# Patient Record
Sex: Male | Born: 1960 | Race: White | Hispanic: No | Marital: Married | Smoking: Former smoker
Health system: Southern US, Community
[De-identification: ages and names within clinical notes are randomized; demographics above are authoritative.]

## PROBLEM LIST (undated history)

## (undated) DIAGNOSIS — C140 Malignant neoplasm of pharynx, unspecified: Secondary | ICD-10-CM

## (undated) DIAGNOSIS — E7800 Pure hypercholesterolemia, unspecified: Secondary | ICD-10-CM

## (undated) DIAGNOSIS — I10 Essential (primary) hypertension: Secondary | ICD-10-CM

## (undated) HISTORY — PX: NO PAST SURGERIES: SHX2092

## (undated) HISTORY — PX: COLONOSCOPY: SHX174

---

## 2012-05-23 ENCOUNTER — Ambulatory Visit: Payer: Self-pay | Admitting: Internal Medicine

## 2012-11-04 ENCOUNTER — Ambulatory Visit: Payer: Self-pay | Admitting: Gastroenterology

## 2015-07-03 DIAGNOSIS — C140 Malignant neoplasm of pharynx, unspecified: Secondary | ICD-10-CM

## 2015-07-03 HISTORY — DX: Malignant neoplasm of pharynx, unspecified: C14.0

## 2015-07-26 ENCOUNTER — Other Ambulatory Visit: Payer: Self-pay | Admitting: Otolaryngology

## 2015-07-26 DIAGNOSIS — C140 Malignant neoplasm of pharynx, unspecified: Secondary | ICD-10-CM

## 2015-07-30 ENCOUNTER — Encounter
Admission: RE | Admit: 2015-07-30 | Discharge: 2015-07-30 | Disposition: A | Discharge status: Home / Self Care | Payer: BLUE CROSS/BLUE SHIELD | Source: Ambulatory Visit | Attending: Otolaryngology | Admitting: Otolaryngology

## 2015-07-30 ENCOUNTER — Ambulatory Visit
Admission: RE | Admit: 2015-07-30 | Discharge: 2015-07-30 | Disposition: A | Discharge status: Home / Self Care | Payer: BLUE CROSS/BLUE SHIELD | Source: Ambulatory Visit | Attending: Otolaryngology | Admitting: Otolaryngology

## 2015-07-30 ENCOUNTER — Other Ambulatory Visit: Payer: Self-pay

## 2015-07-30 DIAGNOSIS — N200 Calculus of kidney: Secondary | ICD-10-CM | POA: Diagnosis not present

## 2015-07-30 DIAGNOSIS — C140 Malignant neoplasm of pharynx, unspecified: Secondary | ICD-10-CM

## 2015-07-30 DIAGNOSIS — Z85819 Personal history of malignant neoplasm of unspecified site of lip, oral cavity, and pharynx: Secondary | ICD-10-CM | POA: Diagnosis present

## 2015-07-30 DIAGNOSIS — C148 Malignant neoplasm of overlapping sites of lip, oral cavity and pharynx: Secondary | ICD-10-CM | POA: Insufficient documentation

## 2015-07-30 DIAGNOSIS — I700 Atherosclerosis of aorta: Secondary | ICD-10-CM | POA: Insufficient documentation

## 2015-07-30 HISTORY — DX: Pure hypercholesterolemia, unspecified: E78.00

## 2015-07-30 HISTORY — DX: Malignant neoplasm of pharynx, unspecified: C14.0

## 2015-07-30 HISTORY — DX: Essential (primary) hypertension: I10

## 2015-07-30 LAB — GLUCOSE, CAPILLARY: Glucose-Capillary: 84 mg/dL (ref 65–99)

## 2015-07-30 MED ORDER — FLUDEOXYGLUCOSE F - 18 (FDG) INJECTION
12.6780 | Freq: Once | INTRAVENOUS | Status: AC
  Administered 2015-07-30: 12.678 via INTRAVENOUS

## 2015-07-30 NOTE — Patient Instructions (Addendum)
  Your procedure is scheduled [id number] August 10 , 2017. Report to Same Day Surgery. To find out your arrival time please call [phone] between 1PM - 3PM on Wednesday August 11, 2015.  Remember: Instructions that are not followed completely may result in serious medical risk, up to and including death, or upon the discretion of your surgeon and anesthesiologist your surgery may need to be rescheduled.    _x___ 1. Do not eat food or drink liquids after midnight. No gum chewing or  hard candies.     _x___ 2. No Alcohol for 24 hours before or after surgery.   ____ 3. Bring all medications with you on the day of surgery if instructed.    __x__ 4. Notify your doctor if there is any change in your medical condition     (cold, fever, infections).  ___x_ 5. No smoking for 24 hours prior to surgery.    Do not wear jewelry, make-up, hairpins, clips or nail polish.  Do not wear lotions, powders, or perfumes. You may wear deodorant.  Do not shave 48 hours prior to surgery. Men may shave face and neck.  Do not bring valuables to the hospital.    [facility] is not responsible for any belongings or valuables.               Contacts, dentures or bridgework may not be worn into surgery.  Leave your suitcase in the car. After surgery it may be brought to your room.  For patients admitted to the hospital, discharge time is determined by your treatment team.   Patients discharged the day of surgery will not be allowed to drive home.    Please read over the following fact sheets that you were given:   [facility] Preparing for Surgery  ____ Take these medicines the morning of surgery with A SIP OF WATER: none    ____ Fleet Enema (as directed)   ____ Use CHG Soap as directed on instruction sheet  ____ Use inhalers on the day of surgery and bring to hospital day of surgery  ____ Stop metformin 2 days prior to surgery    ____ Take 1/2 of usual insulin dose the night before surgery and none  on the morning of surgery.   ____ Stop Coumadin/Plavix/aspirin on does not apply.  _x__ Stop Anti-inflammatories such as Advil, Aleve, Ibuprofen, Motrin, Naproxen,  Naprosyn, Goodies powders or aspirin products. OK to take Tylenol for pain.   _x___ Stop supplements Vitamin C until after surgery.    _x___ Bring C-Pap to the hospital.

## 2015-07-30 NOTE — Pre-Procedure Instructions (Signed)
Medical clearance in chart from Dr. Ouida Sills.  EKG Normal Sinus Rhythm with right bundle branch block, no prior EKG to compare.  Spoke with Dr. Rosey Bath, [place] to proceed.

## 2015-08-09 MED ORDER — SODIUM CHLORIDE FLUSH 0.9 % IV SOLN
INTRAVENOUS | Status: AC
  Filled 2015-08-09: qty 10

## 2015-08-12 ENCOUNTER — Ambulatory Visit: Payer: BLUE CROSS/BLUE SHIELD | Admitting: Anesthesiology

## 2015-08-12 ENCOUNTER — Encounter
Admission: RE | Disposition: A | Discharge status: Home / Self Care | Payer: Self-pay | Source: Ambulatory Visit | Attending: Otolaryngology

## 2015-08-12 ENCOUNTER — Observation Stay
Admission: RE | Admit: 2015-08-12 | Discharge: 2015-08-13 | Disposition: A | Discharge status: Home / Self Care | Payer: BLUE CROSS/BLUE SHIELD | Source: Ambulatory Visit | Attending: Otolaryngology | Admitting: Otolaryngology

## 2015-08-12 DIAGNOSIS — E785 Hyperlipidemia, unspecified: Secondary | ICD-10-CM | POA: Insufficient documentation

## 2015-08-12 DIAGNOSIS — I10 Essential (primary) hypertension: Secondary | ICD-10-CM | POA: Diagnosis not present

## 2015-08-12 DIAGNOSIS — Z87891 Personal history of nicotine dependence: Secondary | ICD-10-CM | POA: Insufficient documentation

## 2015-08-12 DIAGNOSIS — G4733 Obstructive sleep apnea (adult) (pediatric): Secondary | ICD-10-CM | POA: Insufficient documentation

## 2015-08-12 DIAGNOSIS — J351 Hypertrophy of tonsils: Secondary | ICD-10-CM | POA: Diagnosis not present

## 2015-08-12 DIAGNOSIS — C051 Malignant neoplasm of soft palate: Principal | ICD-10-CM | POA: Insufficient documentation

## 2015-08-12 DIAGNOSIS — Z8249 Family history of ischemic heart disease and other diseases of the circulatory system: Secondary | ICD-10-CM | POA: Diagnosis not present

## 2015-08-12 DIAGNOSIS — Z79899 Other long term (current) drug therapy: Secondary | ICD-10-CM | POA: Insufficient documentation

## 2015-08-12 DIAGNOSIS — D3709 Neoplasm of uncertain behavior of other specified sites of the oral cavity: Secondary | ICD-10-CM | POA: Diagnosis present

## 2015-08-12 HISTORY — PX: TONSILLECTOMY: SHX5217

## 2015-08-12 HISTORY — PX: UVULOPALATOPHARYNGOPLASTY: SHX827

## 2015-08-12 SURGERY — TONSILLECTOMY
Anesthesia: General | Wound class: Clean Contaminated

## 2015-08-12 MED ORDER — FENTANYL CITRATE (PF) 100 MCG/2ML IJ SOLN
INTRAMUSCULAR | Status: AC
  Filled 2015-08-12: qty 2

## 2015-08-12 MED ORDER — PRAVASTATIN SODIUM 20 MG PO TABS: 20.0000 mg | ORAL_TABLET | Freq: Every day | ORAL | Status: DC

## 2015-08-12 MED ORDER — ACETAMINOPHEN 10 MG/ML IV SOLN
INTRAVENOUS | Status: DC | PRN
  Administered 2015-08-12: 1000 mg via INTRAVENOUS

## 2015-08-12 MED ORDER — FENTANYL CITRATE (PF) 100 MCG/2ML IJ SOLN
INTRAMUSCULAR | Status: DC | PRN
  Administered 2015-08-12 (×3): 50 ug via INTRAVENOUS

## 2015-08-12 MED ORDER — AMLODIPINE BESYLATE 5 MG PO TABS
5.0000 mg | ORAL_TABLET | Freq: Every day | ORAL | Status: DC
  Filled 2015-08-12: qty 1

## 2015-08-12 MED ORDER — BENAZEPRIL HCL 20 MG PO TABS
20.0000 mg | ORAL_TABLET | Freq: Every day | ORAL | Status: DC
  Filled 2015-08-12: qty 1

## 2015-08-12 MED ORDER — PROPOFOL 10 MG/ML IV BOLUS
INTRAVENOUS | Status: DC | PRN
  Administered 2015-08-12: 150 mg via INTRAVENOUS
  Administered 2015-08-12: 50 mg via INTRAVENOUS

## 2015-08-12 MED ORDER — FENTANYL CITRATE (PF) 100 MCG/2ML IJ SOLN
25.0000 ug | INTRAMUSCULAR | Status: AC | PRN
  Administered 2015-08-12 (×6): 25 ug via INTRAVENOUS

## 2015-08-12 MED ORDER — DEXTROSE-NACL 5-0.45 % IV SOLN
INTRAVENOUS | Status: DC
  Administered 2015-08-12 (×2): via INTRAVENOUS

## 2015-08-12 MED ORDER — LIDOCAINE HCL (CARDIAC) 20 MG/ML IV SOLN
INTRAVENOUS | Status: DC | PRN
  Administered 2015-08-12: 50 mg via INTRAVENOUS

## 2015-08-12 MED ORDER — ACETAMINOPHEN 10 MG/ML IV SOLN
INTRAVENOUS | Status: AC
  Filled 2015-08-12: qty 100

## 2015-08-12 MED ORDER — LABETALOL HCL 5 MG/ML IV SOLN
5.0000 mg | INTRAVENOUS | Status: AC | PRN
  Administered 2015-08-12 (×4): 5 mg via INTRAVENOUS

## 2015-08-12 MED ORDER — BUPIVACAINE HCL 0.5 % IJ SOLN
INTRAMUSCULAR | Status: DC | PRN
  Administered 2015-08-12: 1.5 mL

## 2015-08-12 MED ORDER — AMLODIPINE BESY-BENAZEPRIL HCL 5-20 MG PO CAPS: 1.0000 | ORAL_CAPSULE | ORAL | Status: DC

## 2015-08-12 MED ORDER — OXYCODONE HCL 5 MG/5ML PO SOLN
5.0000 mg | ORAL | Status: DC | PRN
  Administered 2015-08-12 (×2): 5 mg via ORAL
  Filled 2015-08-12 (×2): qty 5

## 2015-08-12 MED ORDER — ONDANSETRON HCL 4 MG/2ML IJ SOLN
4.0000 mg | Freq: Four times a day (QID) | INTRAMUSCULAR | Status: DC | PRN
  Administered 2015-08-12: 4 mg via INTRAVENOUS
  Filled 2015-08-12: qty 2

## 2015-08-12 MED ORDER — VITAMIN C 500 MG PO TABS
500.0000 mg | ORAL_TABLET | ORAL | Status: DC
  Filled 2015-08-12: qty 1

## 2015-08-12 MED ORDER — BUPIVACAINE HCL (PF) 0.5 % IJ SOLN
INTRAMUSCULAR | Status: AC
  Filled 2015-08-12: qty 30

## 2015-08-12 MED ORDER — PHENOL 1.4 % MT LIQD
1.0000 | OROMUCOSAL | Status: DC | PRN
  Filled 2015-08-12 (×4): qty 177

## 2015-08-12 MED ORDER — SENNOSIDES-DOCUSATE SODIUM 8.6-50 MG PO TABS: 1.0000 | ORAL_TABLET | Freq: Every evening | ORAL | Status: DC | PRN

## 2015-08-12 MED ORDER — FAMOTIDINE 20 MG PO TABS
20.0000 mg | ORAL_TABLET | Freq: Once | ORAL | Status: AC
  Administered 2015-08-12: 20 mg via ORAL

## 2015-08-12 MED ORDER — LABETALOL HCL 5 MG/ML IV SOLN
INTRAVENOUS | Status: AC
  Administered 2015-08-12: 5 mg via INTRAVENOUS
  Filled 2015-08-12: qty 4

## 2015-08-12 MED ORDER — FAMOTIDINE 20 MG PO TABS
ORAL_TABLET | ORAL | Status: AC
  Administered 2015-08-12: 20 mg via ORAL
  Filled 2015-08-12: qty 1

## 2015-08-12 MED ORDER — MORPHINE SULFATE (PF) 2 MG/ML IV SOLN: 2.0000 mg | INTRAVENOUS | Status: DC | PRN

## 2015-08-12 MED ORDER — ONDANSETRON HCL 4 MG/2ML IJ SOLN: 4.0000 mg | Freq: Once | INTRAMUSCULAR | Status: DC | PRN

## 2015-08-12 MED ORDER — OXYMETAZOLINE HCL 0.05 % NA SOLN
NASAL | Status: AC
  Filled 2015-08-12: qty 15

## 2015-08-12 MED ORDER — SUCCINYLCHOLINE CHLORIDE 20 MG/ML IJ SOLN
INTRAMUSCULAR | Status: DC | PRN
  Administered 2015-08-12: 100 mg via INTRAVENOUS
  Administered 2015-08-12: 40 mg via INTRAVENOUS

## 2015-08-12 MED ORDER — GLYCOPYRROLATE 0.2 MG/ML IJ SOLN
INTRAMUSCULAR | Status: DC | PRN
  Administered 2015-08-12: 0.2 mg via INTRAVENOUS

## 2015-08-12 MED ORDER — MIDAZOLAM HCL 2 MG/2ML IJ SOLN
INTRAMUSCULAR | Status: DC | PRN
  Administered 2015-08-12: 2 mg via INTRAVENOUS

## 2015-08-12 MED ORDER — ONDANSETRON HCL 4 MG/2ML IJ SOLN
INTRAMUSCULAR | Status: DC | PRN
  Administered 2015-08-12: 4 mg via INTRAVENOUS

## 2015-08-12 MED ORDER — LACTATED RINGERS IV SOLN
INTRAVENOUS | Status: DC
  Administered 2015-08-12: 07:00:00 via INTRAVENOUS

## 2015-08-12 MED ORDER — DEXAMETHASONE SODIUM PHOSPHATE 10 MG/ML IJ SOLN
INTRAMUSCULAR | Status: DC | PRN
  Administered 2015-08-12: 10 mg via INTRAVENOUS

## 2015-08-12 SURGICAL SUPPLY — 27 items
BLADE BOVIE TIP EXT 4 (BLADE) ×3 IMPLANT
BLADE SURG 15 STRL LF DISP TIS (BLADE) ×1 IMPLANT
BLADE SURG 15 STRL SS (BLADE) ×2
CANISTER SUCT 1200ML W/VALVE (MISCELLANEOUS) ×3 IMPLANT
CATH ROBINSON RED A/P 10FR (CATHETERS) IMPLANT
CATH ROBINSON RED A/P 14FR (CATHETERS) ×3 IMPLANT
CNTNR SPEC C3OZ STD GRAD LEK (MISCELLANEOUS) ×3 IMPLANT
COAG SUCT 10F 3.5MM HAND CTRL (MISCELLANEOUS) ×3 IMPLANT
CONT SPEC 3OZ W/LID STRL (MISCELLANEOUS) ×6
ELECT CAUTERY BLADE TIP 2.5 (TIP) ×3
ELECT REM PT RETURN 9FT ADLT (ELECTROSURGICAL) ×3
ELECTRODE CAUTERY BLDE TIP 2.5 (TIP) ×1 IMPLANT
ELECTRODE REM PT RTRN 9FT ADLT (ELECTROSURGICAL) ×1 IMPLANT
GLOVE BIO SURGEON STRL SZ7.5 (GLOVE) ×3 IMPLANT
GLOVE EXAM NITRILE PF LG BLUE (GLOVE) ×3 IMPLANT
GLOVE PROTEXIS LATEX SZ 7.5 (GLOVE) ×3 IMPLANT
GLOVE SURG SYN 6.5 ES PF (GLOVE) ×6 IMPLANT
GOWN STRL REUS W/ TWL LRG LVL3 (GOWN DISPOSABLE) ×2 IMPLANT
GOWN STRL REUS W/TWL LRG LVL3 (GOWN DISPOSABLE) ×4
HANDLE SUCTION POOLE (INSTRUMENTS) ×1 IMPLANT
KIT RM TURNOVER STRD PROC AR (KITS) ×3 IMPLANT
NS IRRIG 500ML POUR BTL (IV SOLUTION) ×3 IMPLANT
PACK HEAD/NECK (MISCELLANEOUS) ×3 IMPLANT
SUCTION POOLE HANDLE (INSTRUMENTS) ×3
SUT VIC AB 4-0 RB1 18 (SUTURE) ×3 IMPLANT
SYR 3ML LL SCALE MARK (SYRINGE) ×3 IMPLANT
SYR BULB IRRIG 60ML STRL (SYRINGE) ×3 IMPLANT

## 2015-08-12 NOTE — Op Note (Signed)
..  08/12/2015  9:55 AM    Frank Lopez  [id number]   Pre-Op Dx:  Squamous Cell Carcinoma of Soft Palate, Tonsillar hypertrophy, Obstructive sleep apnea  Post-op Dx: same  Proc:1)  Resection of Malignant soft palate mass 2)  Uvulopalatopharyngoplasty  Surg: Haleema Vanderheyden  Anes:  General Endotracheal  EBL:  50  Comp:  None  Findings:  Exophytic mass involving uvula and soft palate.  Frozen section margins negative.  Tonsils removed with UP3.  Procedure: After the patient was identified in holding and the history and physical and consent was reviewed, the patient was taken to the operating room and placed in a supine position.  General endotracheal anesthesia was induced in the normal fashion.  At this time, the patient was rotated 45 degrees and a shoulder roll was placed.  At this time, a McIvor mouthgag was inserted into the patient's oral cavity and suspended from the [place] stand without injury to teeth, lips, or gums.  This demonstrated a pendunculated exophytic mass centered on the uvula remnant.  This encroached upon but did not involve the superior tonsils.  Visualization with operating mirror of nasopharynx revealed mucosal involvement on posterior uvula as well.  At this time, the mass was gently retracted with curved alice clamp and mucosal incisions with bovie electrocautery was performs.  A good margin was ensured around the entire mass circumferentially.  Under direct visualization the mass was resected with bovie suction cautery.  An operating mirror was used to view the nasopharynx so all involved tissue was removed along with margin of normal appearing mucosa.  This was removed separate from the tonsils given no obvious involvement.  After the mass was completely removed, frozen section margins of anterior palate, posterior nasopharyngeal palate, as well as left palate where mass was largest were sent.  These were negative for invasive disease and margins were negative.     Next a red rubber catheter was inserted into the patient left nostril for retraction of the uvula and soft palate superiorly.  Next a curved Alice clamp was attached to the patient's right superior tonsillar pole and retracted medially and inferiorly.  A Bovie electrocautery was used to dissect the patient's right tonsil in a subcapsular plane.  Meticulous hemostasis was achieved with Bovie suction cautery.  At this time, the mouth gag was released from suspension for 1 minute.  Attention now was directed to the patient's left side.  In a similar fashion the curved Alice clamp was attached to the superior pole and this was retracted medially and inferiorly and the tonsil was excised in a subcapsular plane with Bovie electrocautery.  After completion of the second tonsil, meticulous hemostasis was continued.  At this time, the patient's nasal cavity and oral cavity was irrigated with sterile saline.   The resulting wound resembled a uvulopalatopharyngoplasty incision and was closed with horizontal mattress sutures 4.0 vicryl. This resulted in good approximation of the anterior and posterior tonsillar pillar mucosa as well as the anterior and posterior soft palate mucosa.  One cc 0.5% Marcaine was injected into the anterior and posterior tonsillar fossa bilaterally.  Following this  The care of patient was returned to anesthesia, awakened, and transferred to recovery in stable condition.  Dispo:  PACU and observation  Plan: Soft diet.  Limit exercise and strenuous activity for 2 weeks.  Fluid hydration  Follow pathology.  Will present at tumor board once staging complete for additional treatment.  Frank Lopez 9:55 AM 08/12/2015 \

## 2015-08-12 NOTE — Progress Notes (Signed)
MD notified of pt nausea and vomiting episode. Vomit is light pink in color (pt had recently taken oxycodone liquid (red coloring).) PRN zofran ordered. MD also aware of pt refusing cpap for the night. Will continue to monitor.

## 2015-08-12 NOTE — Transfer of Care (Signed)
Immediate Anesthesia Transfer of Care Note  Patient: Frank Lopez  Procedure(s) Performed: Procedure(s) with comments: TONSILLECTOMY (N/A) UVULOPALATOPHARYNGOPLASTY (UPPP) (N/A) - resection of solft palate mass  Patient Location: PACU  Anesthesia Type:General  Level of Consciousness: sedated  Airway & Oxygen Therapy: Patient Spontanous Breathing and Patient connected to face mask oxygen  Post-op Assessment: Report given to RN and Post -op Vital signs reviewed and stable  Post vital signs: Reviewed and stable  Last Vitals:  Vitals:   08/12/15 0712  BP: (!) 159/85  Pulse: 81  Resp: 18  Temp: 36.6 C    Last Pain:  Vitals:   08/12/15 0712  TempSrc: Tympanic         Complications: No apparent anesthesia complications

## 2015-08-12 NOTE — Progress Notes (Signed)
..   08/12/2015 5:43 PM  Frank Lopez [id number]  Post-Op Day 0    Temp:  [97.4 F (36.3 C)-98.8 F (37.1 C)] 98.4 F (36.9 C) (08/10 1344) Pulse Rate:  [65-89] 89 (08/10 1344) Resp:  [12-18] 17 (08/10 1344) BP: (137-189)/(74-108) 137/75 (08/10 1344) SpO2:  [94 %-100 %] 94 % (08/10 1344) Weight:  [79.4 kg (175 lb)] 79.4 kg (175 lb) (08/10 0712),     Intake/Output Summary (Last 24 hours) at 08/12/15 1743 Last data filed at 08/12/15 1700  Gross per 24 hour  Intake             1160 ml  Output               50 ml  Net             1110 ml    No results found for this or any previous visit (from the past 24 hour(s)).  SUBJECTIVE:  No acute events.  Pain as expected.  Tolerating some PO but limited.  OBJECTIVE:   GEN- NAD OC/OP- no bleeding  IMPRESSION:  S/p resection of soft palate SCCA/tonsillectomy  PLAN:  Pain control.  Fluid intake.  Advance diet in a.m.  Anticipate d/c tomorrow morning.  [name] 08/12/2015, 5:43 PM

## 2015-08-12 NOTE — Anesthesia Postprocedure Evaluation (Signed)
Anesthesia Post Note  Patient: Frank Lopez  Procedure(s) Performed: Procedure(s) (LRB): TONSILLECTOMY (N/A) UVULOPALATOPHARYNGOPLASTY (UPPP) (N/A)  Patient location during evaluation: PACU    Last Vitals:  Vitals:   08/12/15 1258 08/12/15 1344  BP: (!) 142/74 137/75  Pulse: 85 89  Resp: 16 17  Temp: 37 C 36.9 C    Last Pain:  Vitals:   08/12/15 1417  TempSrc:   PainSc: [place]

## 2015-08-12 NOTE — Anesthesia Preprocedure Evaluation (Addendum)
Anesthesia Evaluation  Patient identified by MRN, date of birth, ID band Patient awake    Reviewed: Allergy & Precautions, NPO status , Patient's Chart, lab work & pertinent test results, reviewed documented beta blocker date and time   Airway Mallampati: II  TM Distance: >3 FB     Dental  (+) Chipped   Pulmonary former smoker,           Cardiovascular hypertension,      Neuro/Psych    GI/Hepatic   Endo/Other    Renal/GU      Musculoskeletal   Abdominal   Peds  Hematology   Anesthesia Other Findings EKG Rbbb. No cardiac symptoms.  Reproductive/Obstetrics                            Anesthesia Physical Anesthesia Plan  ASA: II  Anesthesia Plan: General   Post-op Pain Management:    Induction: Intravenous  Airway Management Planned: Oral ETT  Additional Equipment:   Intra-op Plan:   Post-operative Plan:   Informed Consent: I have reviewed the patients History and Physical, chart, labs and discussed the procedure including the risks, benefits and alternatives for the proposed anesthesia with the patient or authorized representative who has indicated his/her understanding and acceptance.     Plan Discussed with: CRNA  Anesthesia Plan Comments:         Anesthesia Quick Evaluation

## 2015-08-12 NOTE — H&P (Signed)
..  History and Physical paper copy reviewed and updated date of procedure and will be scanned into system.   Reviewed PET results with patient and family.  Reviewed procedure and risks and benefits.  Reviewed velopharyngeal insufficiency risk.

## 2015-08-12 NOTE — Anesthesia Procedure Notes (Signed)
Procedure Name: Intubation Date/Time: 08/12/2015 8:40 AM Performed by: Johnna Acosta Pre-anesthesia Checklist: Patient identified, Emergency Drugs available, Suction available, Patient being monitored and Timeout performed Patient Re-evaluated:Patient Re-evaluated prior to inductionOxygen Delivery Method: Circle system utilized Preoxygenation: Pre-oxygenation with 100% oxygen Intubation Type: IV induction Ventilation: Mask ventilation without difficulty Laryngoscope Size: Miller, 2, Mac, 3 and McGraph Grade View: Grade III Tube type: Oral Rae Tube size: 7.5 mm Number of attempts: 3 Airway Equipment and Method: Stylet Placement Confirmation: ETT inserted through vocal cords under direct vision,  positive ETCO2 and breath sounds checked- equal and bilateral Secured at: 22 cm Tube secured with: Tape Dental Injury: Bloody posterior oropharynx and Teeth and Oropharynx as per pre-operative assessment  Difficulty Due To: Difficulty was unanticipated and Difficult Airway- due to anterior larynx Comments: Attempt X1 by S Jamariya Davidoff  With miller2 unable to visualize due to large epiglottis  Attempt 2 by Dr Marcello Moores with mac 3 unable to lift epiglottis and anterior esophgeal intubation   Intubated on 3rd attempt with mcgrath  Vocal cords visualized.

## 2015-08-13 DIAGNOSIS — C051 Malignant neoplasm of soft palate: Secondary | ICD-10-CM | POA: Diagnosis not present

## 2015-08-13 MED ORDER — TAPENTADOL HCL 50 MG PO TABS: 50.0000 mg | ORAL_TABLET | ORAL | 0 refills | Status: DC | PRN

## 2015-08-13 MED ORDER — SENNOSIDES-DOCUSATE SODIUM 8.6-50 MG PO TABS: 1.0000 | ORAL_TABLET | Freq: Every evening | ORAL | 0 refills | Status: DC | PRN

## 2015-08-13 MED ORDER — LIDOCAINE VISCOUS 2 % MT SOLN: 10.0000 mL | Freq: Four times a day (QID) | OROMUCOSAL | 0 refills | Status: DC | PRN

## 2015-08-13 MED ORDER — PHENOL 1.4 % MT LIQD: 1.0000 | OROMUCOSAL | 0 refills | Status: DC | PRN

## 2015-08-13 MED ORDER — PROMETHAZINE HCL 12.5 MG PO TABS: 12.5000 mg | ORAL_TABLET | Freq: Four times a day (QID) | ORAL | 0 refills | Status: DC | PRN

## 2015-08-13 NOTE — Progress Notes (Signed)
Patient discharged home with family.  All discharge instructions reviewed and discharge paperwork given to patient.  Patient verbalized understanding.  IV removed in tact. Prescriptions given to patient with all questions and concerns addressed. Patient wife at bedside for transfer home.

## 2015-08-13 NOTE — Final Progress Note (Signed)
..   08/13/2015 9:51 AM  Carolee Rota [id number]  Post-Op Day 1    Temp:  [97.4 F (36.3 C)-98.8 F (37.1 C)] 98.7 F (37.1 C) (08/11 0534) Pulse Rate:  [65-89] 69 (08/11 0534) Resp:  [12-20] 16 (08/11 0534) BP: (125-189)/(59-108) 125/70 (08/11 0534) SpO2:  [94 %-100 %] 99 % (08/11 0700),     Intake/Output Summary (Last 24 hours) at 08/13/15 0951 Last data filed at 08/13/15 0800  Gross per 24 hour  Intake           3021.1 ml  Output               50 ml  Net           2971.1 ml    No results found for this or any previous visit (from the past 24 hour(s)).  SUBJECTIVE:  No acute events.  N/V last p.m. After oxycodone but none since.  Tolerating diet with minimal pain medications.  Did not use CPAP last night and 02% >94%.  OBJECTIVE:   GEN- NAD, Alert and oriented OC/OP-  Incision intact with sutures, no active bleeding  IMPRESSION:  S/p UP3, resection of soft palate SCCA  PLAN:  Follow up in 1 week.  OK for discharge.  Change to nucynta.  Mckena Chern 08/13/2015, 9:51 AM

## 2015-08-17 LAB — SURGICAL PATHOLOGY

## 2015-09-28 ENCOUNTER — Other Ambulatory Visit: Payer: Self-pay | Admitting: Otolaryngology

## 2015-09-28 DIAGNOSIS — C052 Malignant neoplasm of uvula: Secondary | ICD-10-CM

## 2015-11-12 ENCOUNTER — Ambulatory Visit
Admission: RE | Admit: 2015-11-12 | Discharge: 2015-11-12 | Disposition: A | Discharge status: Home / Self Care | Payer: BLUE CROSS/BLUE SHIELD | Source: Ambulatory Visit | Attending: Otolaryngology | Admitting: Otolaryngology

## 2015-11-12 DIAGNOSIS — C052 Malignant neoplasm of uvula: Secondary | ICD-10-CM | POA: Diagnosis not present

## 2015-11-12 LAB — GLUCOSE, CAPILLARY: Glucose-Capillary: 82 mg/dL (ref 65–99)

## 2015-11-12 MED ORDER — FLUDEOXYGLUCOSE F - 18 (FDG) INJECTION
12.0000 | Freq: Once | INTRAVENOUS | Status: AC | PRN
  Administered 2015-11-12: 12 via INTRAVENOUS

## 2016-02-16 ENCOUNTER — Ambulatory Visit: Payer: BLUE CROSS/BLUE SHIELD | Attending: Otolaryngology

## 2016-02-16 DIAGNOSIS — G4733 Obstructive sleep apnea (adult) (pediatric): Secondary | ICD-10-CM | POA: Diagnosis not present

## 2016-02-16 DIAGNOSIS — F5101 Primary insomnia: Secondary | ICD-10-CM | POA: Insufficient documentation

## 2016-02-16 DIAGNOSIS — G4730 Sleep apnea, unspecified: Secondary | ICD-10-CM | POA: Diagnosis present

## 2016-03-02 ENCOUNTER — Ambulatory Visit: Payer: BLUE CROSS/BLUE SHIELD | Attending: Otolaryngology

## 2016-03-02 DIAGNOSIS — G4733 Obstructive sleep apnea (adult) (pediatric): Secondary | ICD-10-CM | POA: Diagnosis not present

## 2016-03-02 DIAGNOSIS — F5101 Primary insomnia: Secondary | ICD-10-CM | POA: Diagnosis not present

## 2017-10-28 IMAGING — CT NM PET TUM IMG RESTAG (PS) SKULL BASE T - THIGH
1 of 9 series · 1 of 25 positions shown · non-contrast
Comparison: 07/30/2015

CLINICAL DATA: Subsequent treatment strategy for head and neck
cancer.

EXAM:
NUCLEAR MEDICINE PET SKULL BASE TO THIGH
TECHNIQUE: 12.0 mCi F-18 FDG was injected intravenously. Full-ring PET imaging
was performed from the skull base to thigh after the radiotracer. CT
data was obtained and used for attenuation correction and anatomic
localization.
FASTING BLOOD GLUCOSE:  Value: 82 mg/dl

[Series 3: ct wb 5.0 b30f · axial · 5.0mm · 0.98mm/px · 1 of 329 slices shown]
[im 329/329  brain]
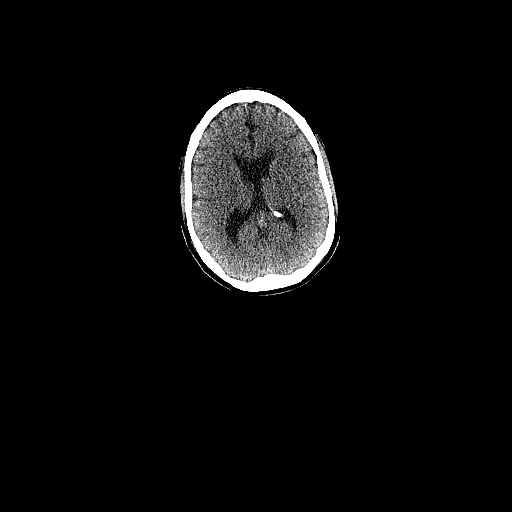

[1 of 25 positions shown; findings below may reference images not displayed]

FINDINGS: NECK

No hypermetabolic lymph nodes in the neck. Previously noted
hypermetabolic tumor involving the uvula has resolved.

CHEST

No hypermetabolic mediastinal or hilar nodes. Aortic atherosclerosis
noted. No suspicious pulmonary nodules on the CT scan.

ABDOMEN/PELVIS

No abnormal hypermetabolic activity within the liver, pancreas,
adrenal glands, or spleen. Small left renal calculi. No
hypermetabolic lymph nodes in the abdomen or pelvis.

SKELETON

No focal hypermetabolic activity to suggest skeletal metastasis.
IMPRESSION: 1. Resolution of previous hypermetabolic tumor involving the uvula.
2. No evidence of cervical nodal metastasis or extra cervical
hypermetabolic metastatic disease.

## 2017-11-28 ENCOUNTER — Encounter: Payer: Self-pay | Admitting: *Deleted

## 2022-05-05 ENCOUNTER — Encounter: Payer: Self-pay | Admitting: Urology

## 2022-05-05 ENCOUNTER — Ambulatory Visit: Payer: BC Managed Care – PPO | Admitting: Urology

## 2022-05-05 VITALS — BP 153/79 | HR 83 | Ht 71.0 in | Wt 193.0 lb

## 2022-05-05 DIAGNOSIS — R972 Elevated prostate specific antigen [PSA]: Secondary | ICD-10-CM | POA: Diagnosis not present

## 2022-05-05 DIAGNOSIS — R3129 Other microscopic hematuria: Secondary | ICD-10-CM

## 2022-05-05 LAB — MICROSCOPIC EXAMINATION

## 2022-05-05 LAB — URINALYSIS, COMPLETE
Bilirubin, UA: NEGATIVE
Glucose, UA: NEGATIVE
Leukocytes,UA: NEGATIVE
Nitrite, UA: NEGATIVE
Protein,UA: NEGATIVE
Specific Gravity, UA: 1.02 (ref 1.005–1.030)
Urobilinogen, Ur: 0.2 mg/dL (ref 0.2–1.0)
pH, UA: 6 (ref 5.0–7.5)

## 2022-05-05 NOTE — Progress Notes (Signed)
I, Maysun L Gibbs,acting as a scribe for Riki Altes, MD.,have documented all relevant documentation on the behalf of Riki Altes, MD,as directed by  Riki Altes, MD while in the presence of Riki Altes, MD.   05/05/2022 9:30 AM   Cheri Rous 1960/01/16 [id number]  Referring provider: Lauro Regulus, MD [address] [facility] [place],  [place] [address]  Chief Complaint  Patient presents with   Elevated PSA    HPI: Frank Lopez is a 62 y.o. male here for evaluation of an elevated PSA.  PSA drawn 03/30/22 was elevated at 5.35. Since 2020, his PSA has been in the low-mid 3 range with the exception of level at 4.08 in February 2022. This was repeated March 2023 and was 3.68. Denies bothersome LUTS  His paternal grandfather had prostate cancer, but no history of prostate cancer in his father.  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   Per [facility] on PET order    Surgical History: Past Surgical History:  Procedure Laterality Date   COLONOSCOPY     NO PAST SURGERIES     TONSILLECTOMY N/A 08/12/2015   Procedure: TONSILLECTOMY;  Surgeon: Bud Face, MD;  Location: [facility] ORS;  Service: ENT;  Laterality: N/A;   UVULOPALATOPHARYNGOPLASTY N/A 08/12/2015   Procedure: UVULOPALATOPHARYNGOPLASTY (UPPP);  Surgeon: Bud Face, MD;  Location: [facility] ORS;  Service: ENT;  Laterality: N/A;  resection of solft palate mass    Home Medications:  Allergies as of 05/05/2022   No Known Allergies      Medication List        Accurate as of May 05, 2022  9:30 AM. If you have any questions, ask your nurse or doctor.          STOP taking these medications    lidocaine 2 % solution Commonly known as: XYLOCAINE Stopped by: Riki Altes, MD   phenol 1.4 % Liqd Commonly known as:  CHLORASEPTIC Stopped by: Riki Altes, MD   promethazine 12.5 MG tablet Commonly known as: PHENERGAN Stopped by: Riki Altes, MD   senna-docusate 8.6-50 MG tablet Commonly known as: Senokot-S Stopped by: Riki Altes, MD   tapentadol 50 MG tablet Commonly known as: Nucynta Stopped by: Riki Altes, MD       TAKE these medications    amLODipine-benazepril 5-20 MG capsule Commonly known as: LOTREL Take 1 capsule by mouth every morning.   ascorbic acid 500 MG tablet Commonly known as: VITAMIN C Take 500 mg by mouth every morning.   Fish Oil 1000 MG Caps Take by mouth.   pravastatin 20 MG tablet Commonly known as: PRAVACHOL Take 20 mg by mouth at bedtime.   traZODone 50 MG tablet Commonly known as: DESYREL Take 50 mg by mouth at bedtime.        Allergies: No Known Allergies   Social History:  reports that he quit smoking about 6 years ago. His smoking use included cigarettes. He has quit using smokeless tobacco. He reports current alcohol use of about 1.0 standard drink of alcohol per week. He reports that he does not use drugs.   Physical Exam: BP (!) 153/79   Pulse 83   Ht 5\' 11"  (1.803 m)   Wt 193 lb (87.5 kg)   BMI 26.92 kg/m  Constitutional:  Alert and oriented, No acute distress. HEENT: [place] AT Respiratory: Normal respiratory effort, no increased work of breathing. GU: Prostate 40 g, smooth without nodules. Skin: No rashes, bruises or suspicious lesions. Neurologic: Grossly intact, no focal deficits, moving all 4 extremities. Psychiatric: Normal mood and affect.  Urinalysis microscopy 11-30 RBC, calcium oxalate crystals  Assessment & Plan:    1. Microhematuria Urinalysis today does show significant microhematuria Urine culture was ordered Recommend initially evaluating the microhematuria with a CT urogram and cystoscopy.  2. Elevated PSA After hematuria evaluation complete, we'll repeat PSA and if persistently elevated we  recommend prostate MRI.  I have reviewed the above documentation for accuracy and completeness, and I agree with the above.   Riki Altes, MD  [facility] [address], [address] [place], [place] [address] [phone]

## 2022-05-09 LAB — CULTURE, URINE COMPREHENSIVE

## 2022-05-19 ENCOUNTER — Ambulatory Visit
Admission: RE | Admit: 2022-05-19 | Discharge: 2022-05-19 | Disposition: A | Discharge status: Home / Self Care | Payer: BC Managed Care – PPO | Source: Ambulatory Visit | Attending: Urology | Admitting: Urology

## 2022-05-19 DIAGNOSIS — R972 Elevated prostate specific antigen [PSA]: Secondary | ICD-10-CM | POA: Diagnosis present

## 2022-05-19 MED ORDER — IOHEXOL 300 MG/ML  SOLN
80.0000 mL | Freq: Once | INTRAMUSCULAR | Status: AC | PRN
  Administered 2022-05-19: 125 mL via INTRAVENOUS

## 2022-05-25 ENCOUNTER — Telehealth: Payer: Self-pay | Admitting: Urology

## 2022-05-25 NOTE — Telephone Encounter (Signed)
Patient is requesting call to get CT results. I did let him know about appt scheduled for 06/07/22, but he would like to speak with someone prior to that.

## 2022-05-25 NOTE — Telephone Encounter (Signed)
CT showed a small renal calculus and a small renal cyst neither of which would be a cause of the amount of blood he had in the urine.  Recommend proceeding with scheduled cystoscopy appointment

## 2022-05-25 NOTE — Telephone Encounter (Signed)
Notified patient as instructed, patient pleased. Discussed follow-up appointments, patient agrees  

## 2022-06-07 ENCOUNTER — Ambulatory Visit (INDEPENDENT_AMBULATORY_CARE_PROVIDER_SITE_OTHER): Payer: BC Managed Care – PPO | Admitting: Urology

## 2022-06-07 ENCOUNTER — Encounter: Payer: Self-pay | Admitting: Urology

## 2022-06-07 VITALS — BP 170/89 | HR 79 | Ht 71.0 in | Wt 187.0 lb

## 2022-06-07 DIAGNOSIS — N281 Cyst of kidney, acquired: Secondary | ICD-10-CM

## 2022-06-07 DIAGNOSIS — R3129 Other microscopic hematuria: Secondary | ICD-10-CM | POA: Diagnosis not present

## 2022-06-07 DIAGNOSIS — N201 Calculus of ureter: Secondary | ICD-10-CM | POA: Diagnosis not present

## 2022-06-07 DIAGNOSIS — N400 Enlarged prostate without lower urinary tract symptoms: Secondary | ICD-10-CM

## 2022-06-07 DIAGNOSIS — R972 Elevated prostate specific antigen [PSA]: Secondary | ICD-10-CM

## 2022-06-07 MED ORDER — TAMSULOSIN HCL 0.4 MG PO CAPS: 0.4000 mg | ORAL_CAPSULE | Freq: Every day | ORAL | 1 refills | Status: DC

## 2022-06-07 NOTE — Progress Notes (Signed)
   06/07/22  CC:  Chief Complaint  Patient presents with   Cysto    HPI: Refer to my prior office note 05/05/2022. CTU performed 05/19/2022 showed a 3 mm nonobstructing left lower pole calculus and a 15 mm left simple renal cyst.  Blood pressure (!) 182/92, pulse 72, height 5\' 11"  (1.803 m), weight 187 lb (84.8 kg). NED. A&Ox3.   No respiratory distress   Abd soft, NT, ND Normal phallus with bilateral descended testicles  Cystoscopy Procedure Note  Patient identification was confirmed, informed consent was obtained, and patient was prepped using Betadine solution.  Lidocaine jelly was administered per urethral meatus.     Pre-Procedure: - Inspection reveals a normal caliber urethral meatus.  Procedure: The flexible cystoscope was introduced without difficulty - No urethral strictures/lesions are present. -  Marked lateral lobe enlargement with hypervacularity  prostate  - Elevated bladder neck - Bilateral ureteral orifices identified - Bladder mucosa  reveals no ulcers, tumors, or lesions - No bladder stones - Moderate trabeculation  Retroflexion shows intavesical median lobe and back bleeding from prostate   Post-Procedure: - Patient tolerated the procedure well  Assessment/ Plan: 3 mm nonobstructing left renal calculus on CTU Prominent BPH with hypervascularity/friability on cystoscopy which is the most likely source of his microhematuria Initially referred for an elevated PSA and Rx tamsulosin 0.4 mg x 30 days sent to pharmacy with a lab visit for repeat PSA in 1 month Prostate MRI if PSA persistently elevated    Riki Altes, MD

## 2022-06-07 NOTE — Progress Notes (Signed)
Frank Lopez presents for an office/procedure visit. BP today is 06/07/2022. He is complaint with BP medication. Greater than 140/90. Provider  notified. Pt advised to contact PCP. Pt voiced understanding.

## 2022-06-08 LAB — MICROSCOPIC EXAMINATION
Bacteria, UA: NONE SEEN
Epithelial Cells (non renal): NONE SEEN /hpf (ref 0–10)

## 2022-06-08 LAB — URINALYSIS, COMPLETE
Bilirubin, UA: NEGATIVE
Glucose, UA: NEGATIVE
Ketones, UA: NEGATIVE
Leukocytes,UA: NEGATIVE
Nitrite, UA: NEGATIVE
Protein,UA: NEGATIVE
RBC, UA: NEGATIVE
Specific Gravity, UA: 1.01 (ref 1.005–1.030)
Urobilinogen, Ur: 0.2 mg/dL (ref 0.2–1.0)
pH, UA: 7 (ref 5.0–7.5)

## 2022-06-29 ENCOUNTER — Other Ambulatory Visit: Payer: Self-pay | Admitting: Urology

## 2022-07-10 ENCOUNTER — Other Ambulatory Visit: Payer: BC Managed Care – PPO

## 2022-07-10 DIAGNOSIS — R972 Elevated prostate specific antigen [PSA]: Secondary | ICD-10-CM

## 2022-07-11 ENCOUNTER — Other Ambulatory Visit: Payer: Self-pay | Admitting: *Deleted

## 2022-07-11 DIAGNOSIS — R972 Elevated prostate specific antigen [PSA]: Secondary | ICD-10-CM

## 2022-07-11 LAB — PSA: Prostate Specific Ag, Serum: 5 ng/mL — ABNORMAL HIGH (ref 0.0–4.0)

## 2022-08-11 ENCOUNTER — Ambulatory Visit: Payer: BC Managed Care – PPO

## 2022-11-07 ENCOUNTER — Telehealth: Payer: Self-pay | Admitting: Urology

## 2022-11-07 NOTE — Telephone Encounter (Signed)
Patient called to ask what the next step is for his treatment. It looks like prostate MRI was cancelled that was scheduled for 08/11/22. Please advise patient

## 2022-11-08 ENCOUNTER — Other Ambulatory Visit: Payer: Self-pay | Admitting: *Deleted

## 2022-11-08 ENCOUNTER — Telehealth: Payer: Self-pay | Admitting: *Deleted

## 2022-11-08 DIAGNOSIS — R972 Elevated prostate specific antigen [PSA]: Secondary | ICD-10-CM

## 2022-11-08 NOTE — Telephone Encounter (Signed)
No , I will put order in now

## 2022-11-08 NOTE — Telephone Encounter (Signed)
Advised patient . He will be coming by the office to pick up prostate mri instructions.

## 2022-11-08 NOTE — Telephone Encounter (Signed)
Prostate MRI Prep:  1- No ejaculation 48 hours prior to exam  2- No caffeine or carbonated beverages on day of the exam  3- Eat light diet evening prior and day of exam  4- Avoid eating 4 hours prior to exam  5- Fleets enema needs to be done 4 hours prior to exam -See below. Can be purchased at the drug store.

## 2022-11-08 NOTE — Telephone Encounter (Signed)
Left message for patient to return the call.

## 2022-11-24 ENCOUNTER — Ambulatory Visit
Admission: RE | Admit: 2022-11-24 | Discharge: 2022-11-24 | Disposition: A | Discharge status: Home / Self Care | Payer: BC Managed Care – PPO | Source: Ambulatory Visit | Attending: Urology | Admitting: Urology

## 2022-11-24 DIAGNOSIS — R972 Elevated prostate specific antigen [PSA]: Secondary | ICD-10-CM | POA: Diagnosis present

## 2022-11-24 MED ORDER — GADOBUTROL 1 MMOL/ML IV SOLN
8.0000 mL | Freq: Once | INTRAVENOUS | Status: AC | PRN
  Administered 2022-11-24: 8 mL via INTRAVENOUS

## 2023-01-05 ENCOUNTER — Telehealth: Payer: Self-pay | Admitting: *Deleted

## 2023-01-05 NOTE — Telephone Encounter (Signed)
 Pt did not get message on mychart, he does not use it, I have deactivated his mychart.  Spoke with patient and advised results, appt already scheduled.   Your prostate MRI did not show any abnormalities that were suspicious for high-grade prostate cancer.  The PI-RADS 3 lesion that was described in the report is considered indeterminate.  Based on your prostate size and PSA level this is most indicative of a benign finding and biopsy is not recommended.  I recommend a 9-month follow-up office visit with a PSA prior.  Please let me know if you have any questions, thanks.  Written by [name], MD on 12/03/2022  6:24 PM EST

## 2023-06-01 ENCOUNTER — Other Ambulatory Visit: Payer: Self-pay

## 2023-06-01 DIAGNOSIS — R972 Elevated prostate specific antigen [PSA]: Secondary | ICD-10-CM

## 2023-06-04 ENCOUNTER — Other Ambulatory Visit: Payer: Self-pay

## 2023-06-04 DIAGNOSIS — R972 Elevated prostate specific antigen [PSA]: Secondary | ICD-10-CM

## 2023-06-05 LAB — PSA: Prostate Specific Ag, Serum: 6.6 ng/mL — ABNORMAL HIGH (ref 0.0–4.0)

## 2023-06-06 ENCOUNTER — Encounter: Payer: Self-pay | Admitting: Urology

## 2023-06-06 ENCOUNTER — Ambulatory Visit: Payer: Self-pay | Admitting: Urology

## 2023-06-06 VITALS — BP 144/84 | HR 83 | Ht 71.0 in | Wt 183.0 lb

## 2023-06-06 DIAGNOSIS — R972 Elevated prostate specific antigen [PSA]: Secondary | ICD-10-CM | POA: Diagnosis not present

## 2023-06-06 NOTE — Progress Notes (Signed)
 I, [name], acting as a scribe for Geraline Knapp, MD., have documented all relevant documentation on the behalf of Geraline Knapp, MD, as directed by Geraline Knapp, MD while in the presence of Geraline Knapp, MD.  06/06/2023 6:26 PM   Frank Lopez 1960/10/03 [id number]  Referring provider: Jimmy Moulding, MD [address] [facility] [address] [place],  [place] [address]  Urologic history 1. Elevated PSA Initially seen 05/2022 for a PSA drawn 03/2022, which was 5.35; repeat PSA was 5.0 Prostate MRI with 41.7 cc volume and a PI-RADS 3 lesion.  PSA density 0.13 and surveillance selected.   2. Microhematuria. CTU 05/2022 with 3 mm non-obstructing left lower pole calculus and 15 mm simple renal cyst. Cystoscopy with lateral lobe enlargement and intravesical median lobe with back bleeding from prostate.   HPI: Frank Lopez is a 63 y.o. male presents for a 6 month follow-up.  No complaints since last visit No bothersome LUTS, dysuria or gross hematuria.  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   Per [facility] on PET order    Surgical History: Past Surgical History:  Procedure Laterality Date   COLONOSCOPY     NO PAST SURGERIES     TONSILLECTOMY N/A 08/12/2015   Procedure: TONSILLECTOMY;  Surgeon: Rogers Clayman, MD;  Location: [facility] ORS;  Service: ENT;  Laterality: N/A;   UVULOPALATOPHARYNGOPLASTY N/A 08/12/2015   Procedure: UVULOPALATOPHARYNGOPLASTY (UPPP);  Surgeon: Rogers Clayman, MD;  Location: [facility] ORS;  Service: ENT;  Laterality: N/A;  resection of solft palate mass    Home Medications:  Allergies as of 06/06/2023   No Known Allergies      Medication List        Accurate as of June 06, 2023  6:26 PM. If you have any questions, ask your nurse or doctor.          amLODipine -benazepril  5-20 MG capsule Commonly known as: LOTREL Take 1 capsule by mouth every morning.   ascorbic acid  500 MG tablet Commonly known as: VITAMIN C  Take 500 mg by mouth every morning.   Fish Oil 1000 MG Caps Take by mouth.   pravastatin  20 MG tablet Commonly known as: PRAVACHOL  Take 20 mg by mouth at bedtime.   tamsulosin  0.4 MG Caps capsule Commonly known as: FLOMAX  Take 1 capsule (0.4 mg total) by mouth daily.   traZODone 50 MG tablet Commonly known as: DESYREL Take 50 mg by mouth at bedtime.        Allergies: No Known Allergies   Social History:  reports that he quit smoking about 7 years ago. His smoking use included cigarettes. He has quit using smokeless tobacco. He reports current alcohol use of about 1.0 standard drink of alcohol per week. He reports that he does not use drugs.   Physical Exam: BP (!) 144/84   Pulse 83   Ht 5\' 11"  (1.803 m)   Wt 183 lb (83 kg)   BMI 25.52 kg/m   Constitutional:  Alert and oriented, No acute distress. HEENT: [place] AT, moist mucus membranes.  Trachea midline, no masses. Cardiovascular: No clubbing, cyanosis, or edema. Respiratory: Normal respiratory  effort, no increased work of breathing. GI: Abdomen is soft, nontender, nondistended, no abdominal masses GU: Prostate 40 grams, smooth without nodules.  Skin: No rashes, bruises or suspicious lesions. Neurologic: Grossly intact, no focal deficits, moving all 4 extremities. Psychiatric: Normal mood and affect.   Assessment & Plan:    1. Elevated PSA PSA has increased to 6.6. Based on last MRI, PSA density is 0.16 We discussed scheduling MR fusion biopsy for this PSA elevation. The procedure was discussed including potential risks of bleeding and infection.  He wanted to hold off on biopsy at this time. He was agreeable to a repeat PSA in 6-10 weeks and fusion biopsy if persistently  elevated.  I have reviewed the above documentation for accuracy and completeness, and I agree with the above.   Geraline Knapp, MD  [facility] [address], [address] [place], [place] [address] [phone]

## 2023-07-18 ENCOUNTER — Other Ambulatory Visit

## 2023-07-18 DIAGNOSIS — R972 Elevated prostate specific antigen [PSA]: Secondary | ICD-10-CM

## 2023-07-19 LAB — PSA: Prostate Specific Ag, Serum: 6.9 ng/mL — ABNORMAL HIGH (ref 0.0–4.0)

## 2023-07-20 ENCOUNTER — Ambulatory Visit: Payer: Self-pay | Admitting: Urology

## 2023-07-20 ENCOUNTER — Telehealth: Payer: Self-pay | Admitting: Urology

## 2023-07-20 NOTE — Telephone Encounter (Signed)
 PSA is higher at 6.9.  Abnormal PSA density at 0.16.  Recommend scheduling MRI fusion biopsy [place] or [place].

## 2023-07-23 NOTE — Telephone Encounter (Signed)
 Patient scheduled for fusion  biopsy . Went over instructions.

## 2023-07-23 NOTE — Telephone Encounter (Signed)
 Called patient and left detailed message of results. Informed patient to call us  back with decision on where he would like his fusion biopsy scheduled

## 2023-07-24 MED ORDER — DIAZEPAM 5 MG PO TABS: ORAL_TABLET | ORAL | 0 refills | Status: AC

## 2023-07-24 NOTE — Addendum Note (Signed)
 Addended by: TWYLLA GLENDIA BROCKS on: 07/24/2023 06:02 PM   Modules accepted: Orders

## 2023-08-30 ENCOUNTER — Telehealth: Payer: Self-pay | Admitting: Urology

## 2023-08-30 NOTE — Telephone Encounter (Signed)
 Pt has fusion biopsy scheduled for Thursday 9/18.  He would like to stop by the [facility] office tomorrow to get a copy of instructions for Fusion Biopsy.  Pt doesn't have MyChart.

## 2023-08-31 ENCOUNTER — Telehealth: Payer: Self-pay | Admitting: Urology

## 2023-08-31 NOTE — Telephone Encounter (Signed)
 Called pt informed him that he is able to have intercourse prior to his prostate biopsy. Pt voiced understanding.

## 2023-08-31 NOTE — Telephone Encounter (Signed)
 Patient is scheduled for fusion biopsy with Dr. Francisca on 09/20/23. He called to ask if it is ok for him to have sex prior to having procedure, or if he needs to abstain. Please advise patient.

## 2023-08-31 NOTE — Telephone Encounter (Signed)
 Pt requesting Fusion Instructions preinted out. Will leave at front desk of [facility] for him to pick up.

## 2023-09-19 NOTE — Telephone Encounter (Signed)
 This encounter was created in error - please disregard.

## 2023-09-20 ENCOUNTER — Ambulatory Visit (INDEPENDENT_AMBULATORY_CARE_PROVIDER_SITE_OTHER): Admitting: Urology

## 2023-09-20 VITALS — BP 172/83 | HR 69 | Wt 183.0 lb

## 2023-09-20 DIAGNOSIS — Z2989 Encounter for other specified prophylactic measures: Secondary | ICD-10-CM | POA: Diagnosis not present

## 2023-09-20 DIAGNOSIS — C61 Malignant neoplasm of prostate: Secondary | ICD-10-CM | POA: Diagnosis not present

## 2023-09-20 DIAGNOSIS — N4231 Prostatic intraepithelial neoplasia: Secondary | ICD-10-CM | POA: Diagnosis not present

## 2023-09-20 DIAGNOSIS — R972 Elevated prostate specific antigen [PSA]: Secondary | ICD-10-CM

## 2023-09-20 MED ORDER — LEVOFLOXACIN 500 MG PO TABS
500.0000 mg | ORAL_TABLET | Freq: Once | ORAL | Status: AC
  Administered 2023-09-20: 500 mg via ORAL

## 2023-09-20 MED ORDER — GENTAMICIN SULFATE 40 MG/ML IJ SOLN
80.0000 mg | Freq: Once | INTRAMUSCULAR | Status: AC
  Administered 2023-09-20: 80 mg via INTRAMUSCULAR

## 2023-09-20 NOTE — Patient Instructions (Signed)

## 2023-09-20 NOTE — Progress Notes (Signed)
   09/20/23  Indication: Elevated PSA, 6.6, abnormal prostate MRI  MRI Fusion Prostate Biopsy Procedure   Informed consent was obtained, and we discussed the risks of bleeding and infection/sepsis. A time out was performed to ensure correct patient identity.  Pre-Procedure: - Last PSA Level: 6.6 - Gentamicin  and levaquin  given for antibiotic prophylaxis -Prostate measured 42 g on MRI, PSA density 0.16 - Small median lobe  Procedure: - Prostate block performed using 10 cc 1% lidocaine   - MRI fusion biopsy was performed, and 3 biopsies were taken from the ROI #1 PIRADS 3 lesion located right peripheral apex - Standard biopsies taken from 6 template areas - Total of 9 cores taken  Post-Procedure: - Patient tolerated the procedure well - He was counseled to seek immediate medical attention if experiences significant bleeding, fevers, or severe pain - Return in one week to discuss biopsy results  Assessment/ Plan: Will follow up in 1-2 weeks to discuss pathology with Dr. Twylla Redell Burnet, MD 09/20/2023

## 2023-09-20 NOTE — Addendum Note (Signed)
 Addended by: ELOUISE SANTA BROCKS on: 09/20/2023 03:43 PM   Modules accepted: Orders

## 2023-09-26 LAB — PROSTATE CORE NEEDLE BIOPSY

## 2023-09-28 ENCOUNTER — Encounter: Payer: Self-pay | Admitting: Urology

## 2023-09-28 ENCOUNTER — Ambulatory Visit: Admitting: Urology

## 2023-09-28 VITALS — BP 161/84 | HR 87 | Wt 183.0 lb

## 2023-09-28 DIAGNOSIS — C61 Malignant neoplasm of prostate: Secondary | ICD-10-CM

## 2023-09-28 NOTE — Progress Notes (Signed)
   09/28/2023 8:34 AM   Frank Lopez [birth year] [id number]  Referring provider: Lenon Layman ORN, MD [address] [facility] [place],  [place] [address]  Chief Complaint  Patient presents with   Results    HPI: Frank Lopez is a 63 y.o. male who presents for prostate biopsy follow-up.  His wife was with him today.  MR fusion biopsy 09/20/2023; PSA 6.6; MRI PI-RADS 3 lesion PZ right apex No postbiopsy complaints Path: 3 ROI cores + sextant biopsies performed; ROI cores showed benign prostate tissue.  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   Per [facility] on PET order    Surgical History: Past Surgical History:  Procedure Laterality Date   COLONOSCOPY     NO PAST SURGERIES     TONSILLECTOMY N/A 08/12/2015   Procedure: TONSILLECTOMY;  Surgeon: Carolee Hunter, MD;  Location: [facility] ORS;  Service: ENT;  Laterality: N/A;   UVULOPALATOPHARYNGOPLASTY N/A 08/12/2015   Procedure: UVULOPALATOPHARYNGOPLASTY (UPPP);  Surgeon: Carolee Hunter, MD;  Location: [facility] ORS;  Service: ENT;  Laterality: N/A;  resection of solft palate mass    Home Medications:  Allergies as of 09/28/2023   No Known Allergies      Medication List        Accurate as of September 28, 2023  8:34 AM. If you have any questions, ask your nurse or doctor.          amLODipine -benazepril  5-20 MG capsule Commonly known as: LOTREL Take 1 capsule by mouth every morning.   ascorbic acid  500 MG tablet Commonly known as: VITAMIN C  Take 500 mg by mouth every morning.   diazepam  5 MG tablet Commonly known as: VALIUM  1 tab po 30 min prior to procedure   Fish Oil 1000 MG Caps Take by mouth.        Allergies: No Known Allergies  Family History: No family history on file.  Social History:  reports that  he quit smoking about 8 years ago. His smoking use included cigarettes. He has quit using smokeless tobacco. He reports current alcohol use of about 1.0 standard drink of alcohol per week. He reports that he does not use drugs.   Physical Exam: BP (!) 161/84   Pulse 87   Wt 183 lb (83 kg)   SpO2 97%   BMI 25.52 kg/m   Constitutional:  Alert, No acute distress. HEENT: [place] AT Respiratory: Normal respiratory effort, no increased work of  Psychiatric: Normal mood and affect.    Assessment & Plan:    1.  Prostate cancer T1c low risk We discussed active surveillance is the preferred management option for low risk prostate cancer. Active surveillance rationale was reviewed and involves PSA levels every 6 months with an annual DRE.  A repeat MRI and confirmatory biopsy is recommended within 1-2 years of the initial biopsy Other management options were reviewed including radical prostatectomy and radiation modalities HIFU was also discussed He has elected active surveillance and a 61-month follow-up appointment was scheduled   Frank JAYSON Barba, MD  [facility] [address] [place], [place] [address] [phone]

## 2024-01-04 ENCOUNTER — Telehealth: Payer: Self-pay

## 2024-01-04 NOTE — Telephone Encounter (Signed)
 Pt LM on triage line  Pt states that 1 x after working out yesterday he noticed his urine was a brown color. He pushed fluids and urine is now a pale yellow.   No fever No pain No blood.   Advised pt to continue to push fluids and if he develops a fever, pain or blood  in his urine to contact office.   Pt voiced understanding.

## 2024-03-28 ENCOUNTER — Other Ambulatory Visit

## 2024-04-01 ENCOUNTER — Ambulatory Visit: Admitting: Urology
